# Patient Record
Sex: Female | Born: 1975 | Race: Asian | Hispanic: No | Marital: Married
Health system: Southern US, Community
[De-identification: ages and names within clinical notes are randomized; demographics above are authoritative.]

---

## 2007-12-16 ENCOUNTER — Inpatient Hospital Stay (HOSPITAL_COMMUNITY): Admission: EM | Admit: 2007-12-16 | Discharge: 2007-12-20 | Payer: Self-pay | Admitting: Emergency Medicine

## 2007-12-25 ENCOUNTER — Emergency Department (HOSPITAL_COMMUNITY): Admission: EM | Admit: 2007-12-25 | Discharge: 2007-12-25 | Payer: Self-pay | Admitting: Emergency Medicine

## 2008-01-06 ENCOUNTER — Inpatient Hospital Stay (HOSPITAL_COMMUNITY): Admission: AD | Admit: 2008-01-06 | Discharge: 2008-01-06 | Payer: Self-pay | Admitting: Obstetrics & Gynecology

## 2010-02-14 IMAGING — CR DG CHEST 2V
2 series · 2 of 2 positions shown · non-contrast
Comparison: None.

CLINICAL DATA: 32-year-old female fever, productive cough,
difficulty sleep been

CHEST - 2 VIEW

[w chest pa]
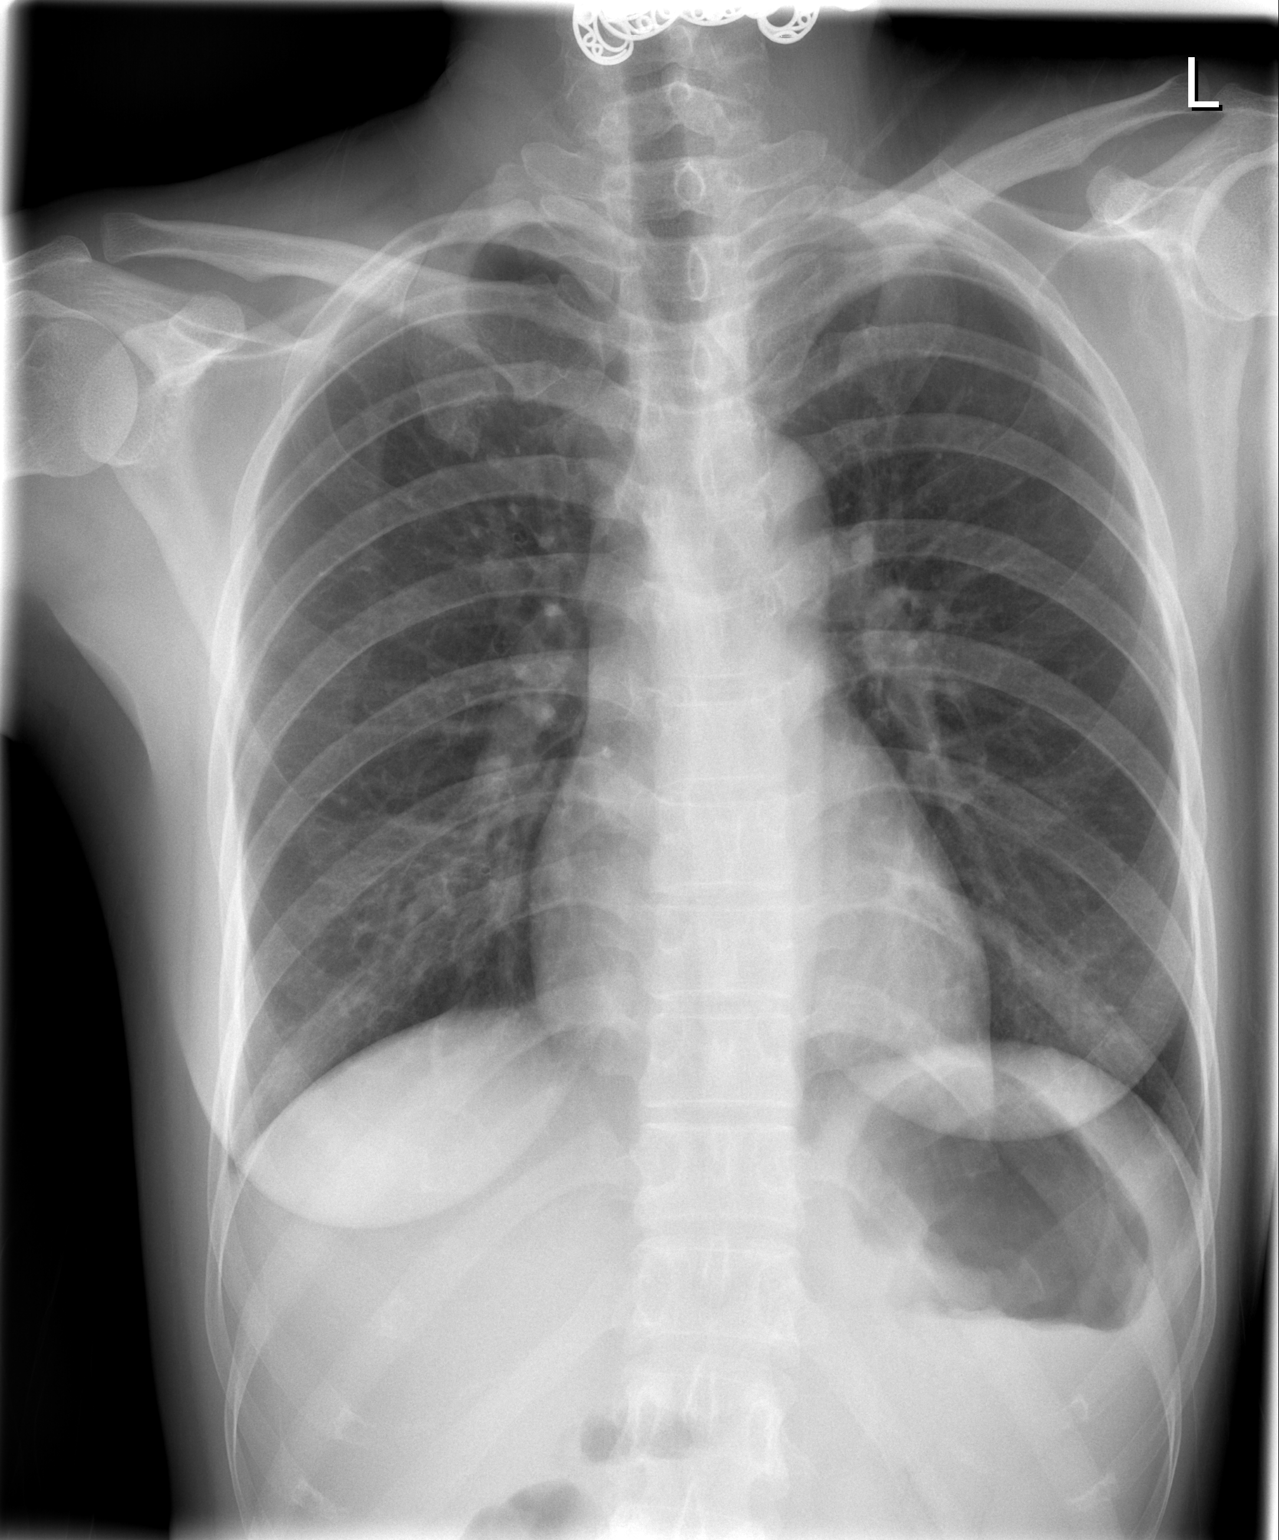

[w chest lat]
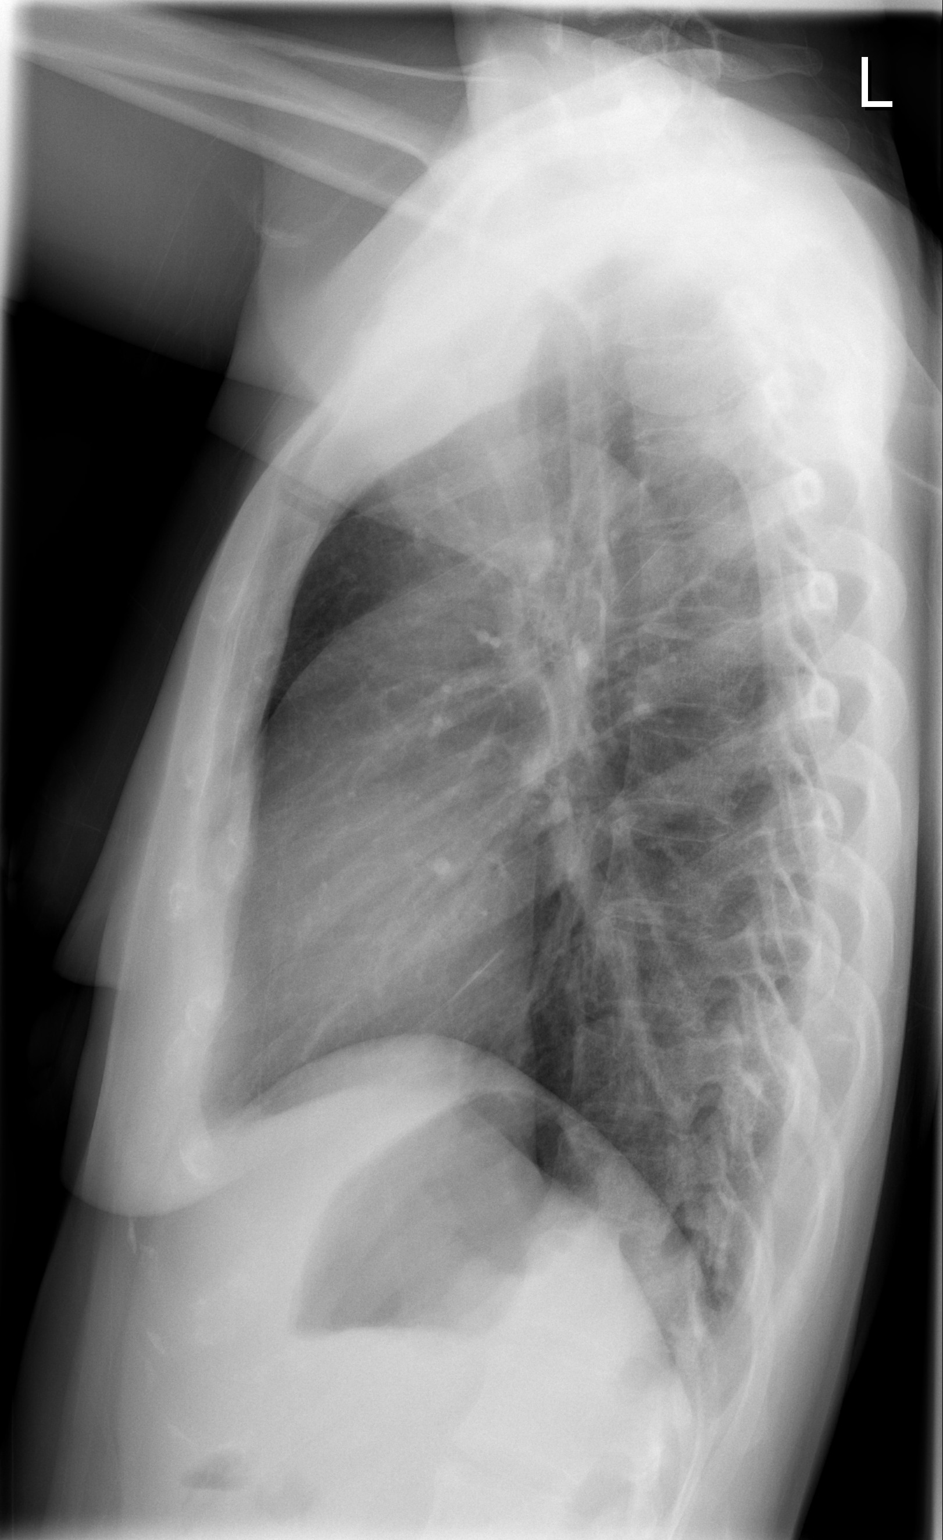

[2 of 2 positions shown; findings below may reference images not displayed]

FINDINGS: Ill-defined vague increased density is noted in the right
lower lung, asymmetric compared to the left.  Early airspace
disease or infiltrate is not entirely excluded.  Left lung is
clear.  No effusion or pneumothorax.  Trachea is midline.  Normal
heart size and vascularity.
IMPRESSION: Right lower lung ill-defined increased density, early airspace
disease or subtle infiltrate not entirely excluded.

Recommend radiographic follow-up to document resolution.

## 2010-06-14 NOTE — Discharge Summary (Signed)
NAMEMARIJKE, GUADIANA                  ACCOUNT NO.:  [id number]   MEDICAL RECORD NO.:  [id number]          PATIENT TYPE:  INP   LOCATION:  1610                         FACILITY:  [facility]   PHYSICIAN:  Elliot Cousin, M.D.    DATE OF BIRTH:  Dec 02, 1975   DATE OF ADMISSION:  12/16/2007  DATE OF DISCHARGE:  12/20/2007                               DISCHARGE SUMMARY   DISCHARGE DIAGNOSES:  1. Right lower lobe pneumonia.  2. Pleuritic chest pain secondary to pneumonia.  3. Seven weeks gestation.  4. Nausea and vomiting secondary to pregnancy.  5. Hypokalemia.  6. Mild normocytic anemia.  7. PPD negative.   DISCHARGE MEDICATIONS:  1. Azithromycin 250 mg daily for four more days.  2. Ceftin 500 mg b.i.d. for four more days.  3. Tamiflu 75 mg b.i.d. for one more day.  4. Zofran ODT 4 mg every 6 hours as needed for nausea.  5. Multivitamin once daily.   DISCHARGE DISPOSITION:  The patient is being discharged home in improved  and stable condition.  An appointment has been scheduled for her to  follow up with the [facility] Department at the  [facility] on January 01, 2008 at 2:30 p.m.   CONSULTATIONS:  1. Case Production designer, theatre/television/film.  2. Registered dietician.   PROCEDURES PERFORMED:  1. Chest x-ray on December 16, 2007.  The results revealed right lower      lung ill-defined increased density, early airspace disease or      subtle infiltrate not entirely excluded.  2. Transvaginal ultrasound on December 16, 2007.  The results revealed      single live intrauterine gestation with average ultrasound age of 7      weeks and 3 days.  No acute findings.   HISTORY OF PRESENT ILLNESS:  The patient is a 35 year old [place]  woman with no significant past medical history, who presented to the  emergency department on December 16, 2007 with a chief complaint of  chest pain, fatigue, and shortness of breath.  When she was evaluated in  the emergency department, her chest x-ray  revealed features compatible  with pneumonia.  The patient had also complained of nausea and vomiting.  A urine pregnancy test was ordered and it was found to be positive.  Subsequently, an OB transvaginal ultrasound was ordered and it revealed  a live pregnancy with estimated gestational age of [redacted] weeks and 3 days.  The patient was admitted for further evaluation and management.   For additional details, please see the dictated history and physical.   HOSPITAL COURSE:  1. RIGHT LOWER LOBE PNEUMONIA.  The patient was placed on droplet      precautions and airborne precautions given her cultural background.      She was then started on empiric antibiotic treatment with Rocephin      and azithromycin.  Tamiflu was also initiated empirically to cover      H1N1 influenza.  At the time of the initial hospital assessment,      the patient's white blood cell count was within normal limits and  she was actually afebrile.  For further evaluation, cardiac enzymes      were ordered as well as a TSH.  Her cardiac enzymes were completely      normal.  Her TSH was within normal limits at 0.774.  Blood cultures      were ordered as well and remained negative during the      hospitalization.  Her serum potassium was found to be 3.3.  She was      repleted with potassium chloride in the IV fluids and orally.      Symptomatic treatment was started with as-needed oxygen therapy.      Sputums to assess for routine culture and AFB were ordered.  The      patient was unable to provide a representative specimen from the      lower respiratory tract.  A PPD was placed.  48 hours later, it was      negative.  Because of the very low clinical suspicion for TB,      airborne isolation was discontinued.   The patient improved symptomatically.  Prior to hospital discharge, she  had no complaints of pleuritic chest pain or shortness of breath.  She  did continue to have an occasional nonproductive cough.  She is  being  discharged home on four more days of antibiotic therapy with  azithromycin and Ceftin and one more day of therapy with Tamiflu.   1. SEVEN WEEKS GESTATION.  The patient complained of nausea and      vomiting during the hospitalization.  She was treated with as-      needed intravenous Zofran.  She was started on a multivitamin with      iron once daily.  The registered dietician was consulted and      provided recommendations with regards to nutrition.  The case      manager was consulted to assist with obtaining an outpatient OB/GYN      appointment.  Elnita Maxwell, case manager, was instrumental in acquiring      an appointment for the patient to be seen at the [facility]      at the [facility] Department on January 01, 2008.   Of note, several interpreters were used during the hospitalization.      Elliot Cousin, M.D.  Electronically Signed     DF/MEDQ  D:  12/20/2007  T:  12/21/2007  Job:  [id number]

## 2010-06-14 NOTE — H&P (Signed)
Deanna Moss, Deanna Moss                  ACCOUNT NO.:  [id number]   MEDICAL RECORD NO.:  [id number]          PATIENT TYPE:  INP   LOCATION:  6702                         FACILITY:  [facility]   PHYSICIAN:  Eduard Clos, MDDATE OF BIRTH:  [birth year]   DATE OF ADMISSION:  12/16/2007  DATE OF DISCHARGE:                              HISTORY & PHYSICAL   History obtained through a translator through the phone.   CHIEF COMPLAINT:  Shortness of breath and chest pain.   HISTORY OF PRESENT ILLNESS:  A 35 year old female from [place] who came  last year, has started developing some shortness of breath, fatigue and  chest pain over the last 3 weeks. The patient has been having these  symptoms gradually insidiously, wherein she started becoming more and  more fatigued, short of breath, and gets chest pain when she takes deep  breath.  The patient came to the ER, where she had a chest x-ray which  showed features compatible with pneumonia.  The patient also in addition  was found to be pregnant, which she did not know before.  She is being  admitted for further management of pneumonia.   The ER physician, Dr. Orlene Och, has already contacted OB/GYN on-  call and advised about this pregnancy and pneumonia.  The OB/GYN doctor  has advised that the patient be admitted through medical service and  they will be in consult.   The patient states that she has been short of breath even at rest and  has no relation to exertion.  The chest pain is a sharp, shooting type  even at the epigastric area when she takes a deep breath, and has not  noticed any weight loss.  She has been having cough with phlegm.  No  hemoptysis; denies any dizziness, diaphoresis, nausea, vomiting,  diarrhea, abdominal pain, headache, dysuria or discharges.   PAST MEDICAL HISTORY:  Nothing significant.   PAST SURGICAL HISTORY:  None.   MEDICATIONS ON ADMISSION:  None.   FAMILY HISTORY:  Noncontributory.   REVIEW OF  SYSTEMS:  As per history of present illness.  Nothing else  significant.   PHYSICAL EXAMINATION:  The patient was examined at bedside; not in acute  distress.  VITAL SIGNS:  Blood pressure 114/70, pulse 80 per minute, temperature  97.9, respirations 18, O2 saturation 100%.  HEENT: Anicteric.  No pallor.  CHEST:  Bilateral air entry present.  No rhonchi.  HEART:  S1 and S2 heard.  ABDOMEN:  Soft, nontender.  Bowel sounds heard.  CNS:  Alert, awake; oriented to time, place and person.  Motor strength  is 5/5.  EXTREMITIES:  Peripheral pulses felt.  No edema.   LABS:  Chest x-ray of the right lower lung ill-defined, increased  density, early air space disease or subtle infiltrate not entirely  excluded.  Transvaginal ultrasound:  A single live intrauterine  gestation with average ultrasound age 55 weeks and 3 days by crown-rump  length.  CBC:  WBC 9.7, hemoglobin 12, hematocrit 35.6, platelets 246,  neutrophils 78%.  Complete Metabolic Panel:  Sodium 134, potassium  3.3,  chloride 100, carbon dioxide 26, glucose 95, BUN 10, creatinine 0.5.  Total bilirubin 0.5, alkaline phosphatase 75.  AST 19, ALT 20, total  protein 7, albumin 3.8, calcium 9.1.  Pregnancy screen is positive.  Urine shows leukocytes small, wbc 3-6, bacteria few.   ASSESSMENT:  1. Pneumonia.  2. Atypical chest pain.  3. Pregnancy of 7 weeks.   PLAN:  Admit the patient to telemetry.  Will place the patient on  respiratory and Doppler isolation.  Will get precautions and start the  patient on empiric antibiotics (Rocephin and Zithromax).  We have  already discussed with the pharmacist about Tamiflu, and as per the  pharmacist the patient has indication for Tamiflu 4 mL.  Will place the  patient on PPD.  Get a sputum for AP culture and sensitivity.  Further  recommendations as the condition evolves.  The patient has been already  notified to OB/GYN and will need an OB/GYN consult.      Eduard Clos, MD   Electronically Signed     ANK/MEDQ  D:  12/16/2007  T:  12/17/2007  Job:  [id number]

## 2010-11-02 LAB — CULTURE, BLOOD (ROUTINE X 2)

## 2010-11-02 LAB — URINALYSIS, ROUTINE W REFLEX MICROSCOPIC
Bilirubin Urine: NEGATIVE
Glucose, UA: NEGATIVE
Hgb urine dipstick: NEGATIVE
Ketones, ur: NEGATIVE
Nitrite: NEGATIVE
Nitrite: NEGATIVE
Protein, ur: 30 — AB
Specific Gravity, Urine: 1.01
Specific Gravity, Urine: 1.026
Urobilinogen, UA: 1
pH: >9 — ABNORMAL HIGH

## 2010-11-02 LAB — CBC
HCT: 30.7 — ABNORMAL LOW
HCT: 35.6 — ABNORMAL LOW
Hemoglobin: 11.4 — ABNORMAL LOW
Hemoglobin: 11.5 — ABNORMAL LOW
MCHC: 33.6
MCHC: 34.5
MCHC: 36.7 — ABNORMAL HIGH
MCV: 88.1
MCV: 89.2
MCV: 90.6
Platelets: 196
Platelets: 242
Platelets: 246
RBC: 4.04
RDW: 13.2
RDW: 13.6
RDW: 13.7
WBC: 6.5

## 2010-11-02 LAB — COMPREHENSIVE METABOLIC PANEL
ALT: 19
ALT: 20
Albumin: 3.8
BUN: 10
CO2: 23
CO2: 26
Creatinine, Ser: 0.45
GFR calc Af Amer: >60
Glucose, Bld: 80
Potassium: 3.9
Sodium: 134 — ABNORMAL LOW
Sodium: 136
Total Bilirubin: 0.4

## 2010-11-02 LAB — DIFFERENTIAL
Basophils Absolute: 0
Basophils Absolute: 0.1
Lymphocytes Relative: 26
Monocytes Relative: 8
Neutro Abs: 3.3
Neutrophils Relative %: 70

## 2010-11-02 LAB — POCT I-STAT, CHEM 8
BUN: 6
Calcium, Ion: 1.25
Chloride: 101
Creatinine, Ser: 0.7
Glucose, Bld: 95
HCT: 33 — ABNORMAL LOW
Hemoglobin: 11.2 — ABNORMAL LOW
Potassium: 3.5
Sodium: 138
TCO2: 27

## 2010-11-02 LAB — LIPID PANEL
Cholesterol: 158
LDL Cholesterol: 110 — ABNORMAL HIGH
Total CHOL/HDL Ratio: 4.3
Triglycerides: 57
VLDL: 11

## 2010-11-02 LAB — CARDIAC PANEL(CRET KIN+CKTOT+MB+TROPI)
Relative Index: INVALID
Total CK: 37
Troponin I: 0.01

## 2010-11-02 LAB — URINE MICROSCOPIC-ADD ON

## 2010-11-02 LAB — BASIC METABOLIC PANEL
CO2: 24
Chloride: 105
Creatinine, Ser: 0.52
Glucose, Bld: 84
Potassium: 3.7
Sodium: 136

## 2010-11-02 LAB — CK TOTAL AND CKMB (NOT AT ARMC): Relative Index: INVALID

## 2010-11-02 LAB — RAPID STREP SCREEN (MED CTR MEBANE ONLY): Streptococcus, Group A Screen (Direct): NEGATIVE

## 2010-11-02 LAB — EXPECTORATED SPUTUM ASSESSMENT W GRAM STAIN, RFLX TO RESP C

## 2010-11-02 LAB — POCT PREGNANCY, URINE: Preg Test, Ur: POSITIVE

## 2010-11-04 LAB — URINALYSIS, ROUTINE W REFLEX MICROSCOPIC
Bilirubin Urine: NEGATIVE
Hgb urine dipstick: NEGATIVE
Protein, ur: NEGATIVE mg/dL
Specific Gravity, Urine: 1.01 (ref 1.005–1.030)
Urobilinogen, UA: 1 mg/dL (ref 0.0–1.0)

## 2013-07-07 ENCOUNTER — Other Ambulatory Visit: Payer: Self-pay

## 2013-08-08 ENCOUNTER — Encounter (HOSPITAL_COMMUNITY): Payer: Self-pay | Admitting: Obstetrics and Gynecology

## 2013-08-08 ENCOUNTER — Other Ambulatory Visit (HOSPITAL_COMMUNITY): Payer: Self-pay | Admitting: *Deleted

## 2013-08-08 DIAGNOSIS — Z3689 Encounter for other specified antenatal screening: Secondary | ICD-10-CM

## 2013-08-19 ENCOUNTER — Other Ambulatory Visit (HOSPITAL_COMMUNITY): Payer: Self-pay | Admitting: Physician Assistant

## 2013-08-19 DIAGNOSIS — IMO0002 Reserved for concepts with insufficient information to code with codable children: Secondary | ICD-10-CM

## 2013-08-19 DIAGNOSIS — Z3689 Encounter for other specified antenatal screening: Secondary | ICD-10-CM

## 2013-08-19 DIAGNOSIS — O09529 Supervision of elderly multigravida, unspecified trimester: Secondary | ICD-10-CM

## 2013-08-19 DIAGNOSIS — Z0489 Encounter for examination and observation for other specified reasons: Secondary | ICD-10-CM

## 2013-08-21 ENCOUNTER — Encounter (HOSPITAL_COMMUNITY): Payer: Self-pay

## 2013-08-21 ENCOUNTER — Ambulatory Visit (HOSPITAL_COMMUNITY): Payer: Medicaid Other

## 2013-08-21 ENCOUNTER — Ambulatory Visit (HOSPITAL_COMMUNITY): Payer: Self-pay

## 2013-09-04 ENCOUNTER — Encounter (HOSPITAL_COMMUNITY): Payer: Self-pay

## 2013-09-04 ENCOUNTER — Ambulatory Visit (HOSPITAL_COMMUNITY)
Admission: RE | Admit: 2013-09-04 | Discharge: 2013-09-04 | Disposition: A | Discharge status: Home / Self Care | Payer: Medicaid Other | Source: Ambulatory Visit | Attending: Obstetrics and Gynecology | Admitting: Obstetrics and Gynecology

## 2013-09-04 DIAGNOSIS — Z1389 Encounter for screening for other disorder: Secondary | ICD-10-CM | POA: Diagnosis not present

## 2013-09-04 DIAGNOSIS — Z0489 Encounter for examination and observation for other specified reasons: Secondary | ICD-10-CM

## 2013-09-04 DIAGNOSIS — Z363 Encounter for antenatal screening for malformations: Secondary | ICD-10-CM | POA: Insufficient documentation

## 2013-09-04 DIAGNOSIS — IMO0002 Reserved for concepts with insufficient information to code with codable children: Secondary | ICD-10-CM | POA: Insufficient documentation

## 2013-09-04 DIAGNOSIS — O09529 Supervision of elderly multigravida, unspecified trimester: Secondary | ICD-10-CM

## 2013-09-04 NOTE — Progress Notes (Signed)
Genetic Counseling  High-Risk Gestation Note  Appointment Date:  09/04/2013 Referred By: Ferman Hamming, MD Date of Birth:  October 08, 1975 Partner:  Burt Ek Herling    Pregnancy History: [id number] Estimated Date of Delivery: 12/05/13 Estimated Gestational Age: [redacted]w[redacted]d Attending: Rema Fendt, MD   Deanna Moss and her husband, Mr. Jola Critzer, were seen for genetic counseling because of a maternal age of 38 y.o..   [facility] Vietnamese/English interpreter provided interpretation for today's visit.   They were counseled regarding maternal age and the association with risk for chromosome conditions due to nondisjunction with aging of the ova.   We reviewed chromosomes, nondisjunction, and the associated 1 in 54 risk for fetal aneuploidy related to a maternal age of 38 y.o. at [redacted]w[redacted]d gestation.  They were counseled that the risk for aneuploidy decreases as gestational age increases, accounting for those pregnancies which spontaneously abort.  We specifically discussed Down syndrome (trisomy 21), trisomies 13 and 3, including the common features and prognoses of each.   Mrs. Izzo previously had Quad screening performed through [facility], which was within normal limits for the conditions screened. We reviewed that Quad screening reduced the risks for fetal Down syndrome (1 in 135 to 1 in 7,211), trisomy 18 (1 in 406 to < 1 in 10,000), and open neural tube defects. We reviewed the screening adjusts the a priori risk for these conditions to provide a pregnancy specific risk assessment but is not diagnostic for these conditions. Additionally, this screen does not assess for all chromosome conditions.   Mrs. 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 Remaining visualized fetal anatomy appeared normal. Complete ultrasound results reported separately. We discussed that the second trimester genetic sonogram is targeted at identifying features associated  with aneuploidy.  It has evolved as a screening tool used to provide an individualized risk assessment for Down syndrome and other trisomies.  The ability of sonography to aid in the detection of aneuploidies relies on identification of both major structural anomalies and "soft markers."  The patient was counseled that the latter term refers to findings that are often normal variants and do not cause any significant medical problems.  Nonetheless, these markers have a known association with aneuploidy.   Absent nasal bone is a highly sensitive and specific marker for Down syndrome.  It is present in approximately 1% of chromosomally normal fetuses, but up to 70% of fetuses with Down syndrome, 55 % with trisomy 18, and 34% with trisomy 13. The nasal bone is typically considered to be hypoplastic if it is less than 2.5 mm in length.  The length of the fetal nasal bone varies by race and ethnicity in second trimester fetuses.  The associated risk of aneuploidy is highest in Caucasians, with a LR of ~50. The association with aneuploidy in the Asian population is likely lower, given that it is more commonly seen as a normal variation in fetuses of Asian descent. However, current literature is not available to provide a specific LR for the finding of absent nasal bone in the Asian population.  Given Mrs. Claunch's maternal age of 38 y.o. and her Quad screen result of 1 in 7,411, we discussed that the adjusted risk for fetal Down syndrome with the ultrasound finding of absent nasal bone would be approximately less than 0.5%.   We reviewed additional available screening option of noninvasive prenatal screening (NIPS)/cell free DNA (cfDNA) testing.  They were counseled that screening tests are used to  modify a patient's a priori risk for aneuploidy, typically based on age. This estimate provides a pregnancy specific risk assessment. We reviewed the benefits and limitations of this option. Specifically, we discussed the  conditions for which each test screens, the detection rates, and false positive rates. They were also counseled regarding diagnostic testing via amniocentesis. We reviewed the approximate 1 in 300-500 risk for complications for amniocentesis, including spontaneous pregnancy loss. After consideration of all the options, they declined NIPS and amniocentesis, stating that they were happy with the assessment provided by Quad screen and targeted ultrasound.  They understand that screening tests cannot rule out all birth defects or genetic syndromes. The patient was advised of this limitation and states she still does not want additional testing at this time.   Both family histories were reviewed and found to be noncontributory for birth defects, intellectual disability, and known genetic conditions. The couple reported that their daughter (born after their currently 28140 year old son but before their currently 39 year old son) died at age 78 year and 4 months in [place]. They stated that the reason is unknown given no medical care available to them at that time. She was reportedly born without health concerns. We discussed that the description indicates likely more of an environmental cause, in which case recurrence risk would not be expected to be increased. However, accurate recurrence risk estimate cannot be performed without information regarding an identified etiology. Without further information regarding the provided family history, an accurate genetic risk cannot be calculated. Further genetic counseling is warranted if more information is obtained.   Mrs. Avonelle Ciresi denied exposure to environmental toxins or chemical agents.  She denied the use of alcohol, tobacco or street drugs. She denied significant viral illnesses during the course of her pregnancy. Her medical and surgical histories were noncontributory.   I counseled this couple regarding the above risks and available options. The approximate  face-to-face time with the genetic counselor was 35 minutes.  Quinn PlowmanKaren Gerrett Loman, MS,  Certified Genetic Counselor 09/04/2013

## 2013-12-01 ENCOUNTER — Encounter (HOSPITAL_COMMUNITY): Payer: Self-pay

## 2014-07-10 ENCOUNTER — Encounter (HOSPITAL_COMMUNITY): Payer: Self-pay | Admitting: *Deleted

## 2015-11-04 IMAGING — US US OB DETAIL+14 WK
1 series · 12 of 28 positions shown · non-contrast
Comparison: none

[Series 1: us ob detail+14 wk · 0.19mm/px · 12 of 84 slices shown]
[im 4/84]
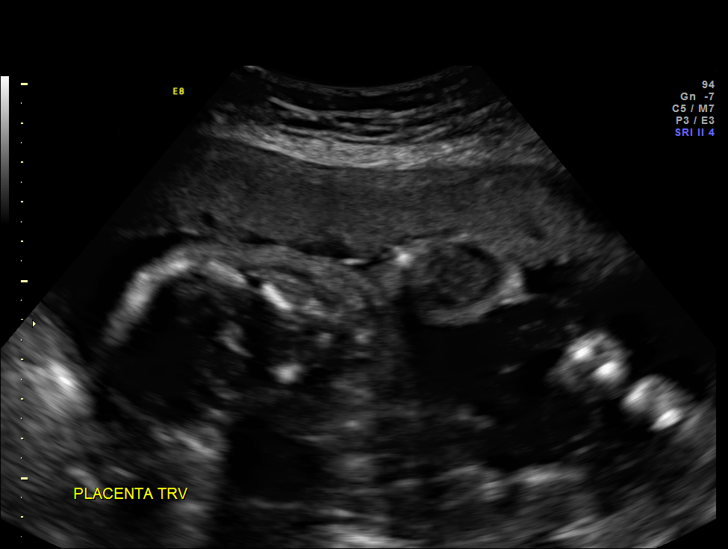
[im 10/84]
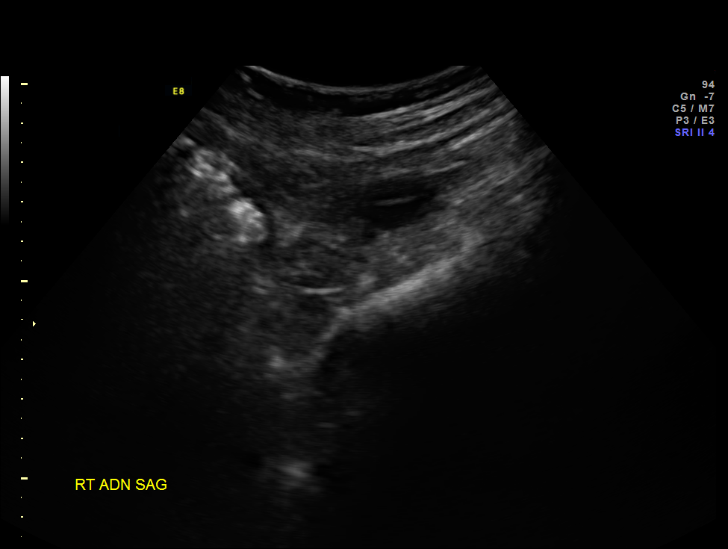
[im 16/84]
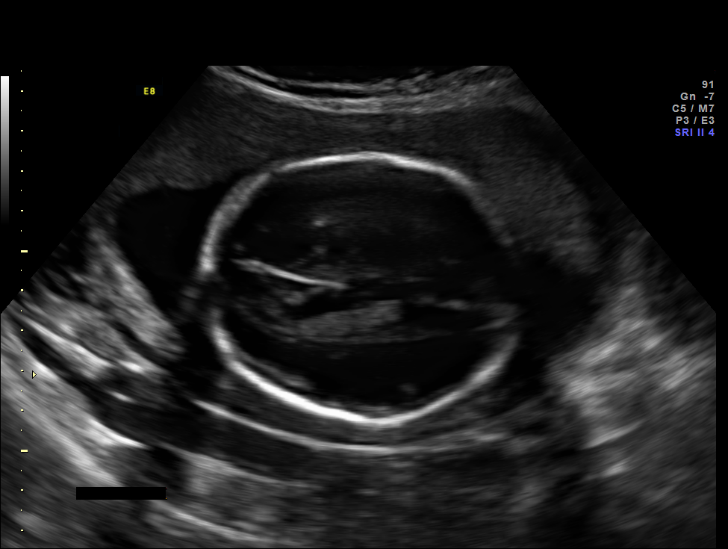
[im 25/84]
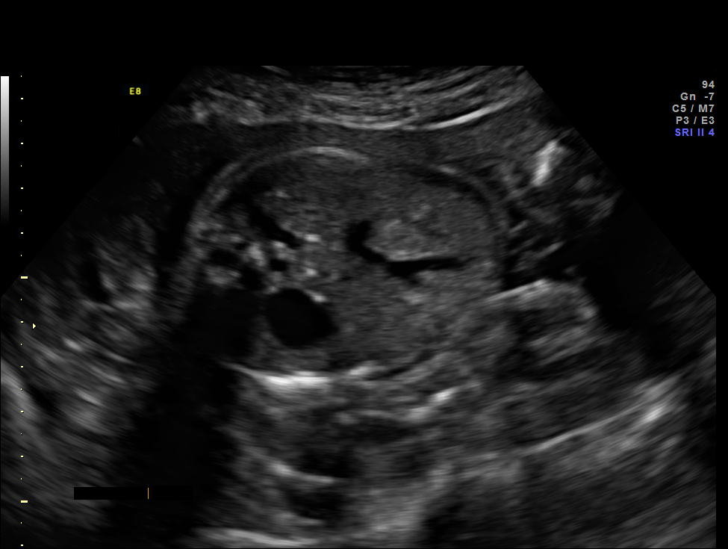
[im 31/84]
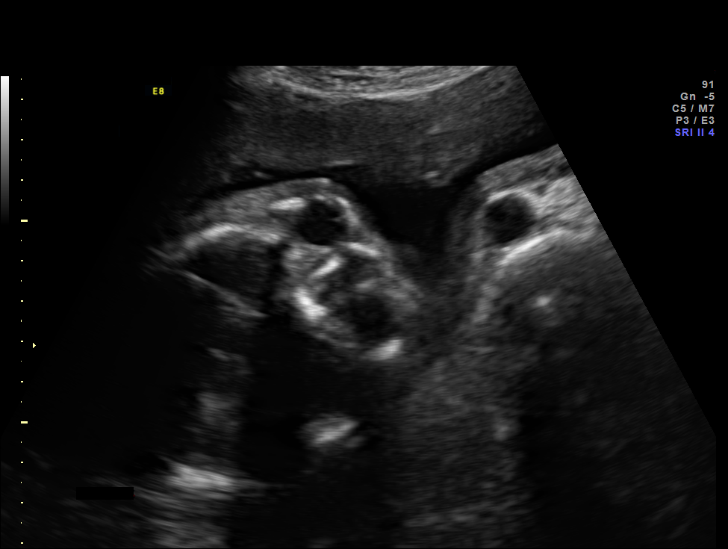
[im 37/84]
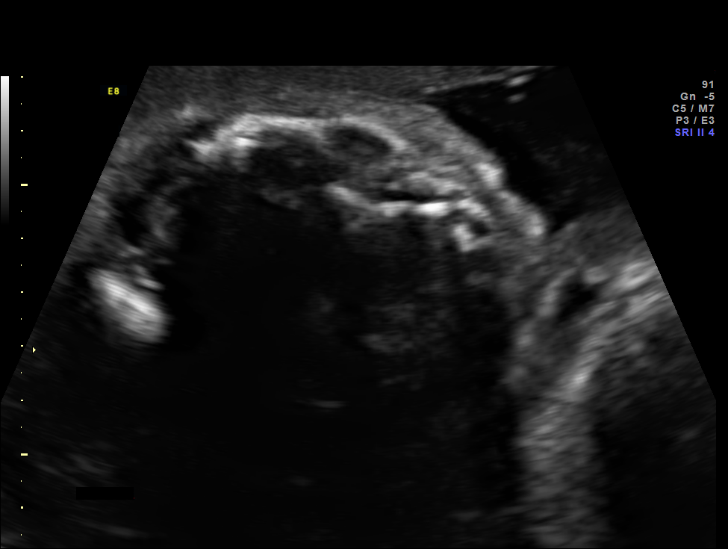
[im 47/84]
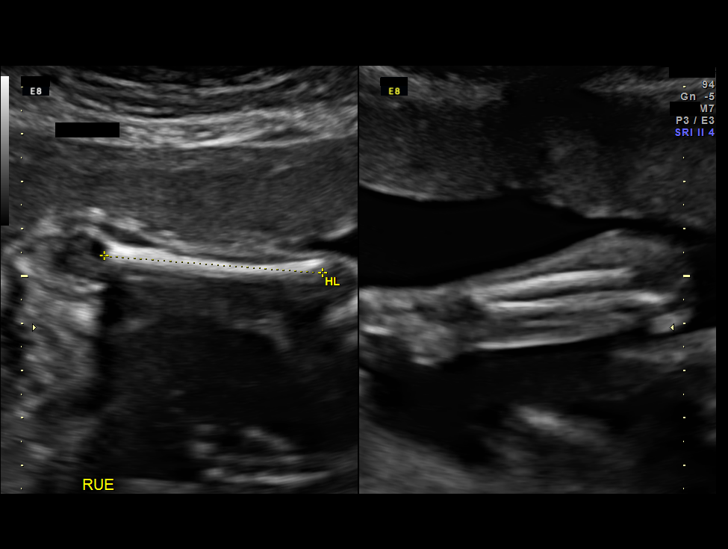
[im 53/84]
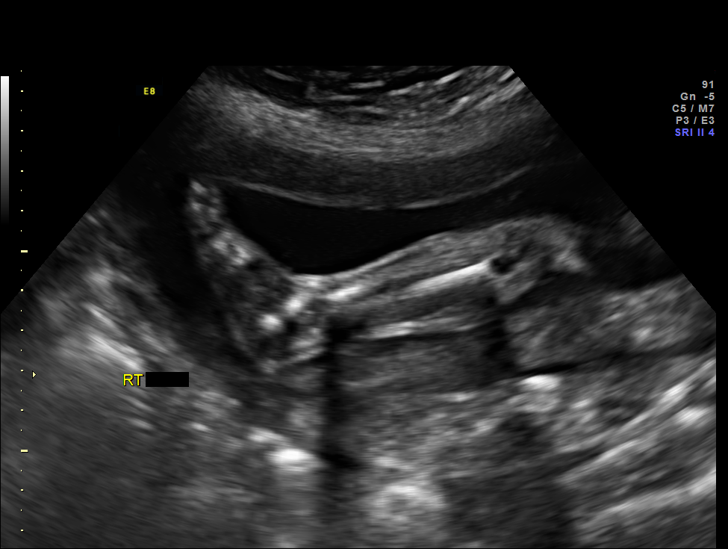
[im 59/84]
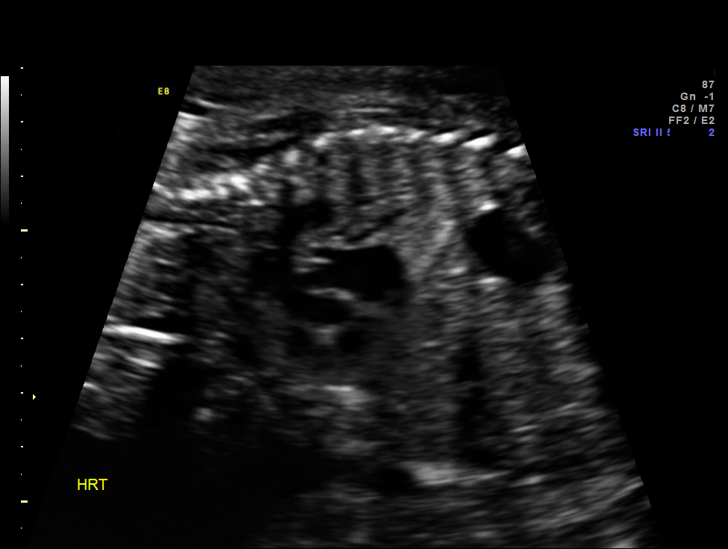
[im 68/84]
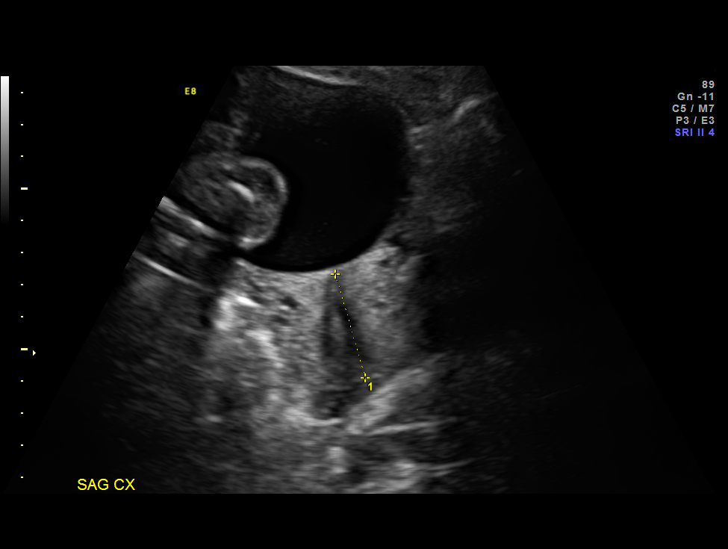
[im 74/84]
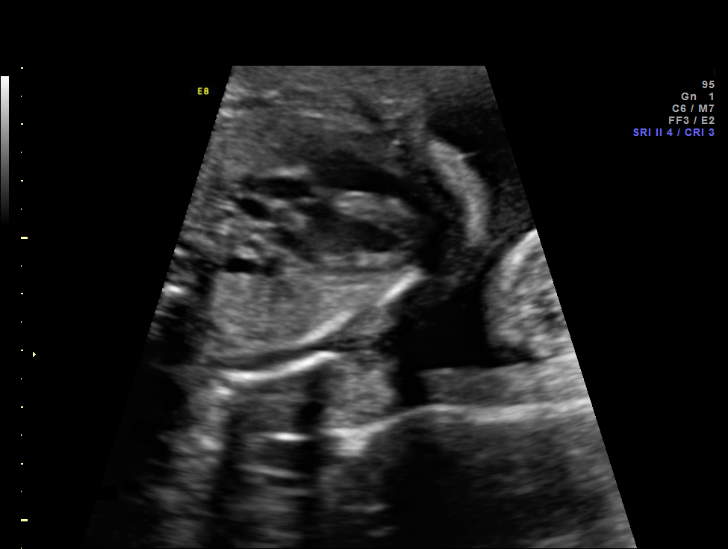
[im 80/84]
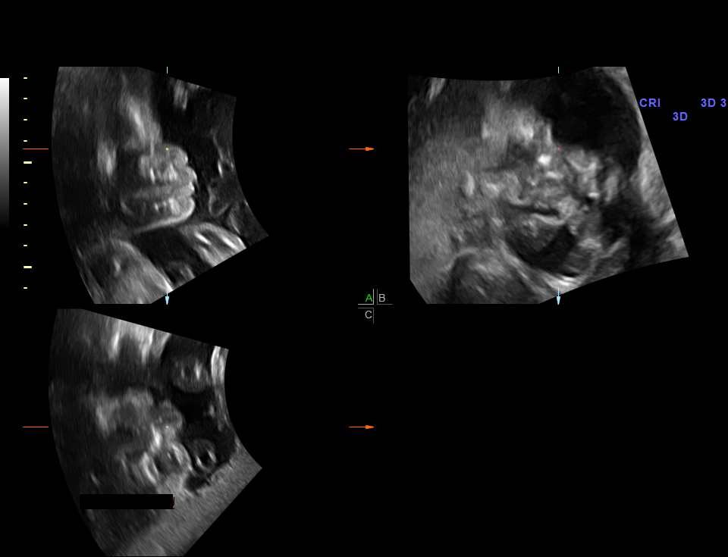

[12 of 28 positions shown; findings below may reference images not displayed]

OBSTETRICS REPORT
                      (Signed Final 09/04/2013 [DATE])

Service(s) Provided

 US OB DETAIL + 14 WK                                  76811.0
Indications

 Detailed fetal anatomic survey
 Advanced maternal age (38), Multigravida -
 Negative QUAD screen
Fetal Evaluation

 Num Of Fetuses:    1
 Fetal Heart Rate:  142                          bpm
 Cardiac Activity:  Observed
 Presentation:      Frank breech
 Placenta:          Anterior, above cervical os
 P. Cord            Visualized, central
 Insertion:

 Amniotic Fluid
 AFI FV:      Subjectively within normal limits
                                             Larg Pckt:     5.4  cm
Biometry

 BPD:     65.7  mm     G. Age:  26w 4d                CI:         75.0   70 - 86
 OFD:     87.6  mm                                    FL/HC:      19.7   18.6 -

 HC:     244.8  mm     G. Age:  26w 4d       17  %    HC/AC:      1.16   1.05 -

 AC:     211.4  mm     G. Age:  25w 5d       12  %    FL/BPD:     73.4   71 - 87
 FL:      48.2  mm     G. Age:  26w 1d       18  %    FL/AC:      22.8   20 - 24
 HUM:     46.3  mm     G. Age:  27w 2d       57  %
 CER:     32.4  mm     G. Age:  28w 3d       74  %

 Est. FW:     876  gm    1 lb 15 oz      34  %
Gestational Age

 LMP:           26w 6d        Date:  02/28/13                 EDD:   12/05/13
 U/S Today:     26w 2d                                        EDD:   12/09/13
 Best:          26w 6d     Det. By:  LMP  (02/28/13)          EDD:   12/05/13
Anatomy
 Cranium:          Appears normal         Aortic Arch:      Appears normal
 Fetal Cavum:      Appears normal         Ductal Arch:      Appears normal
 Ventricles:       Appears normal         Diaphragm:        Appears normal
 Choroid Plexus:   Appears normal         Stomach:          Appears normal, left
                                                            sided
 Cerebellum:       Appears normal         Abdomen:          Appears normal
 Posterior Fossa:  Appears normal         Abdominal Wall:   Appears nml (cord
                                                            insert, abd wall)
 Nuchal Fold:      Not applicable (>20    Cord Vessels:     Appears normal (3
                   wks GA)                                  vessel cord)
 Face:             Appears normal         Kidneys:          Appear normal
                   (orbits and profile)
 Lips:             Appears normal         Bladder:          Appears normal
 Heart:            Not well visualized    Spine:            Appears normal
 RVOT:             Not well visualized    Lower             Visualized
                                          Extremities:
 LVOT:             Not well visualized    Upper             Visualized
                                          Extremities:

 Other:  Heels visualized. Technically difficult due to fetal position.
Targeted Anatomy

 Fetal Central Nervous System
 Cisterna Magna:
Cervix Uterus Adnexa

 Cervical Length:    3.3      cm

 Cervix:       Normal appearance by transabdominal scan.

 Adnexa:     No abnormality visualized.
Comments

 The patient's fetal anatomic survey is now complete.  An
 absent nasal bone is noted on today's ultrasound,  No other
 fetal anomalies or soft markers of aneuploidy were seen.  An
 absent nasal bone is associated with an increased risk of
 Downs Syndrome.  The patient also had genetics counseling
 today.  For full details of that visit please see accompanying
 documentation.  The patient decided to forgo any further
 testing.
Impression

 Single living intrauterine pregnancy at 26 weeks 6 days.
 Appropriate fetal growth (34%).
 Normal amniotic fluid volume.
 Absent nasal bone.
 Otherwise normal fetal anatomy.
 No other fetal anomalies or soft markers of aneuploidy seen.
Recommendations

 Follow-up ultrasounds as clinically indicated.
 Thank you for sharing in the care of Ms. MAJA ADNAN WHITENTON with
 questions or concerns.
                Kenny, [place]

## 2023-01-22 ENCOUNTER — Other Ambulatory Visit: Payer: 59
